# Patient Record
Sex: Female | Born: 2000 | Race: White | Hispanic: No | Marital: Single | Smoking: Current every day smoker
Health system: Southern US, Community
[De-identification: ages and names within clinical notes are randomized; demographics above are authoritative.]

## PROBLEM LIST (undated history)

## (undated) DIAGNOSIS — J45909 Unspecified asthma, uncomplicated: Secondary | ICD-10-CM

## (undated) DIAGNOSIS — A749 Chlamydial infection, unspecified: Principal | ICD-10-CM

## (undated) HISTORY — DX: Chlamydial infection, unspecified: A74.9

---
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
## 2019-01-02 ENCOUNTER — Other Ambulatory Visit (HOSPITAL_COMMUNITY)
Admission: RE | Admit: 2019-01-02 | Discharge: 2019-01-02 | Disposition: A | Discharge status: Home / Self Care | Payer: Medicaid Other | Source: Ambulatory Visit | Attending: Obstetrics and Gynecology | Admitting: Obstetrics and Gynecology

## 2019-01-02 ENCOUNTER — Other Ambulatory Visit: Payer: Self-pay

## 2019-01-02 ENCOUNTER — Ambulatory Visit: Payer: Medicaid Other

## 2019-01-02 VITALS — BP 106/77 | HR 72

## 2019-01-02 DIAGNOSIS — Z202 Contact with and (suspected) exposure to infections with a predominantly sexual mode of transmission: Secondary | ICD-10-CM

## 2019-01-02 NOTE — Progress Notes (Signed)
SUBJECTIVE:  18 y.o. female complains of  vaginal discharge for a couple of days.  Denies abnormal vaginal bleeding or significant pelvic pain or fever. No UTI symptoms. Denies history of known exposure to STD.  No LMP recorded.  OBJECTIVE:  She appears well, afebrile. Urine dipstick: not at this time  ASSESSMENT:  Vaginal Discharge:small amount  Vaginal Odor: none    PLAN:  GC, chlamydia, trichomonas, BVAG, CVAG probe sent to lab. Treatment: To be determined once lab results are received ROV prn if symptoms persist or worsen.

## 2019-01-03 ENCOUNTER — Other Ambulatory Visit: Payer: Self-pay | Admitting: Obstetrics & Gynecology

## 2019-01-03 DIAGNOSIS — B9689 Other specified bacterial agents as the cause of diseases classified elsewhere: Secondary | ICD-10-CM

## 2019-01-03 LAB — CERVICOVAGINAL ANCILLARY ONLY
Bacterial Vaginitis (gardnerella): POSITIVE — AB
Candida Glabrata: NEGATIVE
Candida Vaginitis: NEGATIVE
Chlamydia: NEGATIVE
Comment: NEGATIVE
Comment: NEGATIVE
Comment: NEGATIVE
Comment: NEGATIVE
Comment: NEGATIVE
Comment: NORMAL
Neisseria Gonorrhea: NEGATIVE
Trichomonas: NEGATIVE

## 2019-01-03 MED ORDER — TINIDAZOLE 500 MG PO TABS: 2.0000 g | ORAL_TABLET | Freq: Every day | ORAL | 2 refills | Status: DC

## 2019-01-29 ENCOUNTER — Ambulatory Visit: Payer: Medicaid Other

## 2019-01-29 ENCOUNTER — Other Ambulatory Visit: Payer: Self-pay

## 2019-01-29 DIAGNOSIS — Z111 Encounter for screening for respiratory tuberculosis: Secondary | ICD-10-CM

## 2019-01-29 DIAGNOSIS — Z23 Encounter for immunization: Secondary | ICD-10-CM

## 2019-01-29 NOTE — Progress Notes (Signed)
Patient given Menveo. Declined flu and Men B Richmond Campbell, RN

## 2019-02-01 ENCOUNTER — Telehealth: Payer: Self-pay

## 2019-02-01 ENCOUNTER — Ambulatory Visit (LOCAL_COMMUNITY_HEALTH_CENTER): Payer: Medicaid Other

## 2019-02-01 ENCOUNTER — Other Ambulatory Visit: Payer: Medicaid Other

## 2019-02-01 ENCOUNTER — Other Ambulatory Visit: Payer: Self-pay

## 2019-02-01 DIAGNOSIS — Z111 Encounter for screening for respiratory tuberculosis: Secondary | ICD-10-CM

## 2019-02-01 LAB — TB SKIN TEST
Induration: 0 mm
TB Skin Test: NEGATIVE

## 2019-02-01 NOTE — Telephone Encounter (Signed)
Client DNKA this am in Nurse Clinic for [facility]. Call to client who states forgot appt and PPDR appt rescheduled for this afternoon. Jossie Ng, RN

## 2019-02-22 ENCOUNTER — Other Ambulatory Visit: Payer: Self-pay

## 2019-02-22 ENCOUNTER — Ambulatory Visit (INDEPENDENT_AMBULATORY_CARE_PROVIDER_SITE_OTHER): Payer: Medicaid Other | Admitting: Family Medicine

## 2019-02-22 ENCOUNTER — Encounter: Payer: Self-pay | Admitting: Family Medicine

## 2019-02-22 ENCOUNTER — Other Ambulatory Visit (HOSPITAL_COMMUNITY)
Admission: RE | Admit: 2019-02-22 | Discharge: 2019-02-22 | Disposition: A | Discharge status: Home / Self Care | Payer: Medicaid Other | Source: Ambulatory Visit | Attending: Family Medicine | Admitting: Family Medicine

## 2019-02-22 VITALS — BP 103/75 | HR 72 | Wt 106.0 lb

## 2019-02-22 DIAGNOSIS — Z113 Encounter for screening for infections with a predominantly sexual mode of transmission: Secondary | ICD-10-CM

## 2019-02-22 NOTE — Progress Notes (Signed)
    Subjective:    Patient ID: Dawn Howell is a 19 y.o. female presenting with No chief complaint on file.  on 02/22/2019  HPI: Noticed some red bumps in the middle of her vaginal. No burning and no blisters. Denies fevers, chills, no dysuria, + vaginal discharge. No oder, no irritation. No new partner.  Review of Systems  Constitutional: Negative for chills and fever.  Respiratory: Negative for shortness of breath.   Cardiovascular: Negative for chest pain.  Gastrointestinal: Negative for abdominal pain, nausea and vomiting.  Genitourinary: Negative for dysuria.  Skin: Negative for rash.      Objective:    BP 103/75   Pulse 72   Wt 106 lb (48.1 kg)   BMI 18.78 kg/m  Physical Exam Constitutional:      General: She is not in acute distress.    Appearance: She is well-developed.  HENT:     Head: Normocephalic and atraumatic.  Eyes:     General: No scleral icterus. Cardiovascular:     Rate and Rhythm: Normal rate.  Pulmonary:     Effort: Pulmonary effort is normal.  Abdominal:     Palpations: Abdomen is soft.  Genitourinary:    Comments: NEFG, no warts, no blisters, minimal white discharge.  Musculoskeletal:     Cervical back: Neck supple.  Skin:    General: Skin is warm and dry.  Neurological:     Mental Status: She is alert and oriented to person, place, and time.         Assessment & Plan:   Problem List Items Addressed This Visit    None    Visit Diagnoses    Screening for STDs (sexually transmitted diseases)    -  Primary   no evidence of any bumps--reviewed pics of common issues, check STD, wet prep.   Relevant Orders   Cervicovaginal ancillary only( [place])      Total face-to-face time with patient: 15 minutes. Over 50% of encounter was spent on counseling and coordination of care. Return in about 3 months (around 05/23/2019), or if symptoms worsen or fail to improve.  Reva Bores 02/22/2019 3:32 PM

## 2019-02-22 NOTE — Progress Notes (Signed)
Patient reports having small bumps on vaginal area for 1-2 weeks. No itching or burning.

## 2019-02-23 LAB — CERVICOVAGINAL ANCILLARY ONLY
Bacterial Vaginitis (gardnerella): NEGATIVE
Candida Glabrata: NEGATIVE
Candida Vaginitis: NEGATIVE
Chlamydia: NEGATIVE
Comment: NEGATIVE
Comment: NEGATIVE
Comment: NEGATIVE
Comment: NEGATIVE
Comment: NEGATIVE
Comment: NORMAL
Neisseria Gonorrhea: NEGATIVE
Trichomonas: NEGATIVE

## 2019-06-13 ENCOUNTER — Ambulatory Visit: Payer: Medicaid Other

## 2019-06-14 ENCOUNTER — Ambulatory Visit: Payer: Medicaid Other

## 2019-08-13 ENCOUNTER — Other Ambulatory Visit: Payer: Self-pay

## 2019-08-13 ENCOUNTER — Emergency Department
Admission: EM | Admit: 2019-08-13 | Discharge: 2019-08-13 | Disposition: A | Discharge status: Home / Self Care | Payer: Medicaid Other | Attending: Emergency Medicine | Admitting: Emergency Medicine

## 2019-08-13 DIAGNOSIS — R109 Unspecified abdominal pain: Secondary | ICD-10-CM | POA: Diagnosis not present

## 2019-08-13 DIAGNOSIS — R112 Nausea with vomiting, unspecified: Secondary | ICD-10-CM | POA: Diagnosis not present

## 2019-08-13 DIAGNOSIS — J45909 Unspecified asthma, uncomplicated: Secondary | ICD-10-CM | POA: Diagnosis not present

## 2019-08-13 DIAGNOSIS — K529 Noninfective gastroenteritis and colitis, unspecified: Secondary | ICD-10-CM

## 2019-08-13 DIAGNOSIS — N898 Other specified noninflammatory disorders of vagina: Secondary | ICD-10-CM | POA: Insufficient documentation

## 2019-08-13 LAB — URINALYSIS, COMPLETE (UACMP) WITH MICROSCOPIC
Bacteria, UA: NONE SEEN
Bilirubin Urine: NEGATIVE
Glucose, UA: NEGATIVE mg/dL
Hgb urine dipstick: NEGATIVE
Ketones, ur: NEGATIVE mg/dL
Leukocytes,Ua: NEGATIVE
Nitrite: NEGATIVE
Protein, ur: NEGATIVE mg/dL
Specific Gravity, Urine: 1.009 (ref 1.005–1.030)
pH: 6 (ref 5.0–8.0)

## 2019-08-13 LAB — WET PREP, GENITAL
Clue Cells Wet Prep HPF POC: NONE SEEN
Sperm: NONE SEEN
Trich, Wet Prep: NONE SEEN
Yeast Wet Prep HPF POC: NONE SEEN

## 2019-08-13 LAB — COMPREHENSIVE METABOLIC PANEL
ALT: 12 U/L (ref 0–44)
AST: 27 U/L (ref 15–41)
Albumin: 4.1 g/dL (ref 3.5–5.0)
Alkaline Phosphatase: 56 U/L (ref 38–126)
Anion gap: 6 (ref 5–15)
BUN: 9 mg/dL (ref 6–20)
CO2: 26 mmol/L (ref 22–32)
Calcium: 8.9 mg/dL (ref 8.9–10.3)
Chloride: 105 mmol/L (ref 98–111)
Creatinine, Ser: 0.77 mg/dL (ref 0.44–1.00)
GFR calc Af Amer: >60 mL/min (ref >60)
GFR calc non Af Amer: >60 mL/min (ref >60)
Glucose, Bld: 90 mg/dL (ref 70–99)
Potassium: 3.7 mmol/L (ref 3.5–5.1)
Sodium: 137 mmol/L (ref 135–145)
Total Bilirubin: 0.6 mg/dL (ref 0.3–1.2)
Total Protein: 7.8 g/dL (ref 6.5–8.1)

## 2019-08-13 LAB — POCT PREGNANCY, URINE: Preg Test, Ur: NEGATIVE

## 2019-08-13 LAB — CBC
HCT: 42.8 % (ref 36.0–46.0)
Hemoglobin: 14.5 g/dL (ref 12.0–15.0)
MCH: 29.8 pg (ref 26.0–34.0)
MCHC: 33.9 g/dL (ref 30.0–36.0)
MCV: 87.9 fL (ref 80.0–100.0)
Platelets: 221 10*3/uL (ref 150–400)
RBC: 4.87 MIL/uL (ref 3.87–5.11)
RDW: 12.3 % (ref 11.5–15.5)
WBC: 4.2 10*3/uL (ref 4.0–10.5)
nRBC: 0 % (ref 0.0–0.2)

## 2019-08-13 LAB — CHLAMYDIA/NGC RT PCR (ARMC ONLY)
Chlamydia Tr: NOT DETECTED
N gonorrhoeae: NOT DETECTED

## 2019-08-13 LAB — LIPASE, BLOOD: Lipase: 29 U/L (ref 11–51)

## 2019-08-13 MED ORDER — SODIUM CHLORIDE 0.9% FLUSH: 3.0000 mL | Freq: Once | INTRAVENOUS | Status: DC

## 2019-08-13 MED ORDER — OXYCODONE-ACETAMINOPHEN 5-325 MG PO TABS
1.0000 | ORAL_TABLET | Freq: Once | ORAL | Status: AC
  Administered 2019-08-13: 1 via ORAL
  Filled 2019-08-13: qty 1

## 2019-08-13 MED ORDER — DICYCLOMINE HCL 10 MG PO CAPS: 10.0000 mg | ORAL_CAPSULE | Freq: Four times a day (QID) | ORAL | 0 refills | Status: DC

## 2019-08-13 MED ORDER — ONDANSETRON HCL 4 MG PO TABS: 4.0000 mg | ORAL_TABLET | Freq: Every day | ORAL | 0 refills | Status: DC | PRN

## 2019-08-13 MED ORDER — ONDANSETRON 4 MG PO TBDP
4.0000 mg | ORAL_TABLET | Freq: Once | ORAL | Status: AC
  Administered 2019-08-13: 4 mg via ORAL
  Filled 2019-08-13: qty 1

## 2019-08-13 NOTE — ED Provider Notes (Signed)
[facility] Emergency Department Provider Note  Time seen: 3:33 PM  I have reviewed the triage vital signs and the nursing notes.   HISTORY  Chief Complaint Abdominal Pain   HPI Dawn Howell is a 19 y.o. female with no significant past medical history presents to the emergency department for abdominal pain.  According to the patient since this morning she has been experiencing intermittent sharp pain in her abdomen.  On review of systems patient also states vaginal discharge.  Denies any dysuria.  Does state nausea with an episode of vomiting this morning.  Denies diarrhea.  Last menstrual period was several weeks ago the patient states she is on birth control and has irregular periods.  Denies any known fever.   Past Medical History:  Diagnosis Date  . Asthma   . Chlamydia infection 02/21/2017 and 07/26/2017 and 10/25/2017    Patient Active Problem List   Diagnosis Date Noted  . Nexplanon insertion 02/01/2018  . Chlamydia infection 02/21/2017    History reviewed. No pertinent surgical history.  Prior to Admission medications   Medication Sig Start Date End Date Taking? Authorizing Provider  tinidazole (TINDAMAX) 500 MG tablet Take 4 tablets (2,000 mg total) by mouth daily with breakfast. For two days Patient not taking: Reported on 02/22/2019 01/03/19   Tereso Newcomer, MD    No Known Allergies  No family history on file.  Social History Social History   Tobacco Use  . Smoking status: Never Smoker  . Smokeless tobacco: Never Used  Vaping Use  . Vaping Use: Never used  Substance Use Topics  . Alcohol use: No  . Drug use: No    Review of Systems Constitutional: Negative for fever. Cardiovascular: Negative for chest pain. Respiratory: Negative for shortness of breath. Gastrointestinal: Intermittent abdominal pain.  Positive nausea.  Negative for diarrhea. Genitourinary: Negative for urinary compaints.  Positive for vaginal  discharge. Musculoskeletal: Negative for musculoskeletal complaints Neurological: Negative for headache All other ROS negative  ____________________________________________   PHYSICAL EXAM:  VITAL SIGNS: ED Triage Vitals  Enc Vitals Group     BP 08/13/19 1312 104/60     Pulse Rate 08/13/19 1312 65     Resp 08/13/19 1312 18     Temp 08/13/19 1312 99 F (37.2 C)     Temp src --      SpO2 08/13/19 1312 99 %     Weight 08/13/19 1310 106 lb (48.1 kg)     Height 08/13/19 1310 5\' 2"  (1.575 m)     Head Circumference --      Peak Flow --      Pain Score 08/13/19 1310 7     Pain Loc --      Pain Edu? --      Excl. in [place]? --    Constitutional: Alert and oriented. Well appearing and in no distress. Eyes: Normal exam ENT      Head: Normocephalic and atraumatic.      Mouth/Throat: Mucous membranes are moist. Cardiovascular: Normal rate, regular rhythm.  Respiratory: Normal respiratory effort without tachypnea nor retractions. Breath sounds are clear Gastrointestinal: Soft, slight tenderness diffusely without focal tenderness identified.  No rebound guarding or distention. Musculoskeletal: Nontender with normal range of motion in all extremities.  Neurologic:  Normal speech and language. No gross focal neurologic deficits  Skin:  Skin is warm, dry and intact.  Psychiatric: Mood and affect are normal.   ____________________________________________    INITIAL IMPRESSION / ASSESSMENT AND  PLAN / ED COURSE  Pertinent labs & imaging results that were available during my care of the patient were reviewed by me and considered in my medical decision making (see chart for details).   Patient presents emergency department for abdominal discomfort starting this morning along with nausea.  Denies diarrhea.  No fever.  No dysuria.  Does state vaginal discharge.  Pelvic exam shows minimal discharge, no significant cervical tenderness or erythema.  Wet prep GC/chlamydia have been sent.  Urinalysis  pending.  We will continue to closely monitor.  Basic labs including CBC and CMP and lipase are largely normal.  Patient's urinalysis is normal as well.  Wet prep and GC/committee have resulted negative.  Overall patient appears very well.  We will treat as symptomatic gastroenteritis, we will prescribe Zofran and Bentyl to be used as needed.  Patient agreeable plan of care.  Discussed return precautions with the patient and her mother.  Dawn Howell was evaluated in Emergency Department on 08/13/2019 for the symptoms described in the history of present illness. She was evaluated in the context of the global COVID-19 pandemic, which necessitated consideration that the patient might be at risk for infection with the SARS-CoV-2 virus that causes COVID-19. Institutional protocols and algorithms that pertain to the evaluation of patients at risk for COVID-19 are in a state of rapid change based on information released by regulatory bodies including the CDC and federal and state organizations. These policies and algorithms were followed during the patient's care in the ED.  ____________________________________________   FINAL CLINICAL IMPRESSION(S) / ED DIAGNOSES  Abdominal pain   Minna Antis, MD 08/13/19 1813

## 2019-08-13 NOTE — ED Triage Notes (Signed)
Pt comes via [facility] from home with c/o abdominal pain. Pt states the pain started earlier this am. Pt states some vomiting. Pt denies any diarrhea.  Pt denies any pregnancy. Pt denies any urinary symptoms

## 2019-08-30 ENCOUNTER — Other Ambulatory Visit (HOSPITAL_COMMUNITY)
Admission: RE | Admit: 2019-08-30 | Discharge: 2019-08-30 | Disposition: A | Discharge status: Home / Self Care | Payer: Medicaid Other | Source: Ambulatory Visit | Attending: Family Medicine | Admitting: Family Medicine

## 2019-08-30 ENCOUNTER — Other Ambulatory Visit: Payer: Self-pay

## 2019-08-30 ENCOUNTER — Ambulatory Visit (INDEPENDENT_AMBULATORY_CARE_PROVIDER_SITE_OTHER): Payer: Medicaid Other | Admitting: *Deleted

## 2019-08-30 VITALS — BP 110/74 | HR 85

## 2019-08-30 DIAGNOSIS — N898 Other specified noninflammatory disorders of vagina: Secondary | ICD-10-CM | POA: Insufficient documentation

## 2019-08-30 NOTE — Progress Notes (Signed)
SUBJECTIVE:  19 y.o. female complains of an increase in vaginal discharge. Denies abnormal vaginal bleeding or significant pelvic pain or fever. No UTI symptoms. Denies history of known exposure to STD.  No LMP recorded.  OBJECTIVE:  She appears well, afebrile. Urine dipstick: not done.  ASSESSMENT:  Vaginal Discharge     PLAN:  GC, chlamydia, trichomonas, BVAG, CVAG probe sent to lab. Treatment: To be determined once lab results are received ROV prn if symptoms persist or worsen.

## 2019-08-30 NOTE — Progress Notes (Signed)
Attestation of Attending Supervision of clinical support staff: I agree with the care provided to this patient and was available for any consultation.  I have reviewed the RN's note and chart. I was available for consult and to see the patient if needed.   Ludell Zacarias Niles Layza Summa, MD, MPH, ABFM Attending Physician Faculty Practice- [facility]  

## 2019-08-31 LAB — CERVICOVAGINAL ANCILLARY ONLY
Bacterial Vaginitis (gardnerella): NEGATIVE
Candida Glabrata: NEGATIVE
Candida Vaginitis: NEGATIVE
Chlamydia: NEGATIVE
Comment: NEGATIVE
Comment: NEGATIVE
Comment: NEGATIVE
Comment: NEGATIVE
Comment: NEGATIVE
Comment: NORMAL
Neisseria Gonorrhea: NEGATIVE
Trichomonas: NEGATIVE

## 2020-11-26 ENCOUNTER — Ambulatory Visit (HOSPITAL_COMMUNITY): Admit: 2020-11-26 | Payer: No Typology Code available for payment source

## 2020-11-28 ENCOUNTER — Other Ambulatory Visit: Payer: Self-pay

## 2020-11-28 ENCOUNTER — Ambulatory Visit
Admission: EM | Admit: 2020-11-28 | Discharge: 2020-11-28 | Disposition: A | Discharge status: Home / Self Care | Payer: Medicaid Other | Attending: Emergency Medicine | Admitting: Emergency Medicine

## 2020-11-28 ENCOUNTER — Encounter: Payer: Self-pay | Admitting: Emergency Medicine

## 2020-11-28 DIAGNOSIS — Z113 Encounter for screening for infections with a predominantly sexual mode of transmission: Secondary | ICD-10-CM | POA: Diagnosis not present

## 2020-11-28 DIAGNOSIS — R300 Dysuria: Secondary | ICD-10-CM | POA: Insufficient documentation

## 2020-11-28 LAB — POCT URINALYSIS DIP (MANUAL ENTRY)
Bilirubin, UA: NEGATIVE
Glucose, UA: NEGATIVE mg/dL
Ketones, POC UA: NEGATIVE mg/dL
Nitrite, UA: NEGATIVE
Protein Ur, POC: 30 mg/dL — AB
Spec Grav, UA: >=1.03 — AB (ref 1.010–1.025)
Urobilinogen, UA: 0.2 E.U./dL
pH, UA: 5.5 (ref 5.0–8.0)

## 2020-11-28 LAB — POCT URINE PREGNANCY: Preg Test, Ur: NEGATIVE

## 2020-11-28 MED ORDER — CEPHALEXIN 500 MG PO CAPS: 500.0000 mg | ORAL_CAPSULE | Freq: Two times a day (BID) | ORAL | 0 refills | Status: DC

## 2020-11-28 NOTE — Discharge Instructions (Addendum)
Take the antibiotic as directed.  The urine culture is pending.  We will call you if it shows the need to change or discontinue your antibiotic.   ° °Your vaginal tests are pending.  If your test results are positive, we will call you.  Do not have sexual activity for at least 7 days.   ° °Follow up with your primary care provider if your symptoms are not improving.   ° °

## 2020-11-28 NOTE — ED Provider Notes (Signed)
Dawn Howell    CSN: [id number] Arrival date & time: 11/28/20  1304      History   Chief Complaint Chief Complaint  Patient presents with   Dysuria   Urinary Frequency     HPI Dawn Howell is a 20 y.o. female.  Patient presents with 3-day history of dysuria and urinary frequency.  She also reports scant pink vaginal discharge but is just finishing her menstrual cycle.  She requests STD testing.  She is sexually active in a monogamous relationship.  She denies fever, chills, abdominal pain, back pain, pelvic pain, or other symptoms.  Her medical history includes asthma and chlamydia.  The history is provided by the patient and medical records.   Past Medical History:  Diagnosis Date   Asthma    Chlamydia infection 02/21/2017 and 07/26/2017 and 10/25/2017    Patient Active Problem List   Diagnosis Date Noted   Nexplanon insertion 02/01/2018   Chlamydia infection 02/21/2017    History reviewed. No pertinent surgical history.  OB History     Gravida  0   Para  0   Term  0   Preterm  0   AB  0   Living  0      SAB  0   IAB  0   Ectopic  0   Multiple  0   Live Births  0            Home Medications    Prior to Admission medications   Medication Sig Start Date End Date Taking? Authorizing Provider  cephALEXin (KEFLEX) 500 MG capsule Take 1 capsule (500 mg total) by mouth 2 (two) times daily for 5 days. 11/28/20 12/03/20 Yes Mickie Bail, NP  dicyclomine (BENTYL) 10 MG capsule Take 1 capsule (10 mg total) by mouth 4 (four) times daily for 14 days. 08/13/19 08/27/19  Minna Antis, MD  ondansetron (ZOFRAN) 4 MG tablet Take 1 tablet (4 mg total) by mouth daily as needed for nausea or vomiting. 08/13/19   Minna Antis, MD  tinidazole (TINDAMAX) 500 MG tablet Take 4 tablets (2,000 mg total) by mouth daily with breakfast. For two days Patient not taking: Reported on 02/22/2019 01/03/19   Tereso Newcomer, MD    Family History History  reviewed. No pertinent family history.  Social History Social History   Tobacco Use   Smoking status: Never   Smokeless tobacco: Never  Vaping Use   Vaping Use: Never used  Substance Use Topics   Alcohol use: No   Drug use: No     Allergies   Patient has no known allergies.   Review of Systems Review of Systems  Constitutional:  Negative for chills and fever.  Respiratory:  Negative for cough and shortness of breath.   Cardiovascular:  Negative for chest pain and palpitations.  Gastrointestinal:  Negative for abdominal pain and vomiting.  Genitourinary:  Positive for dysuria, frequency and vaginal discharge. Negative for flank pain, hematuria and pelvic pain.  Musculoskeletal:  Negative for arthralgias and back pain.  Skin:  Negative for color change and rash.  All other systems reviewed and are negative.   Physical Exam Triage Vital Signs ED Triage Vitals  Enc Vitals Group     BP      Pulse      Resp      Temp      Temp src      SpO2      Weight  Height      Head Circumference      Peak Flow      Pain Score      Pain Loc      Pain Edu?      Excl. in [place]?    No data found.  Updated Vital Signs BP 104/72 (BP Location: Left Arm)   Pulse (!) 107   Temp 99.4 F (37.4 C) (Oral)   LMP 11/20/2020   SpO2 95%   Visual Acuity Right Eye Distance:   Left Eye Distance:   Bilateral Distance:    Right Eye Near:   Left Eye Near:    Bilateral Near:     Physical Exam Vitals and nursing note reviewed.  Constitutional:      General: She is not in acute distress.    Appearance: She is well-developed. She is not ill-appearing.  HENT:     Head: Normocephalic and atraumatic.     Mouth/Throat:     Mouth: Mucous membranes are moist.  Eyes:     Conjunctiva/sclera: Conjunctivae normal.  Cardiovascular:     Rate and Rhythm: Normal rate and regular rhythm.     Heart sounds: Normal heart sounds.  Pulmonary:     Effort: Pulmonary effort is normal. No  respiratory distress.     Breath sounds: Normal breath sounds.  Abdominal:     Palpations: Abdomen is soft.     Tenderness: There is no abdominal tenderness. There is no right CVA tenderness, left CVA tenderness, guarding or rebound.  Musculoskeletal:     Cervical back: Neck supple.  Skin:    General: Skin is warm and dry.  Neurological:     Mental Status: She is alert.  Psychiatric:        Mood and Affect: Mood normal.        Behavior: Behavior normal.     UC Treatments / Results  Labs (all labs ordered are listed, but only abnormal results are displayed) Labs Reviewed  POCT URINALYSIS DIP (MANUAL ENTRY) - Abnormal; Notable for the following components:      Result Value   Clarity, UA cloudy (*)    Spec Grav, UA >=1.030 (*)    Blood, UA large (*)    Protein Ur, POC =30 (*)    Leukocytes, UA Moderate (2+) (*)    All other components within normal limits  URINE CULTURE  POCT URINE PREGNANCY  CERVICOVAGINAL ANCILLARY ONLY    EKG   Radiology No results found.  Procedures Procedures (including critical care time)  Medications Ordered in UC Medications - No data to display  Initial Impression / Assessment and Plan / UC Course  I have reviewed the triage vital signs and the nursing notes.  Pertinent labs & imaging results that were available during my care of the patient were reviewed by me and considered in my medical decision making (see chart for details).  Dysuria, STD screening.  Treating with Keflex. Urine culture pending. Discussed with patient that we will call her if the urine culture shows the need to change or discontinue the antibiotic.  Patient obtained vaginal self swab for testing.  Discussed with patient that she may require treatment if her test results are positive.  Instructed her to abstain from sexual activity for at least 7 days.  Instructed her to follow-up with her PCP if her symptoms are not improving.  Patient agrees to plan of care.        Final Clinical Impressions(s) / UC Diagnoses  Final diagnoses:  Dysuria  Screening for STD (sexually transmitted disease)     Discharge Instructions      Take the antibiotic as directed.  The urine culture is pending.  We will call you if it shows the need to change or discontinue your antibiotic.    Your vaginal tests are pending.  If your test results are positive, we will call you.  Do not have sexual activity for at least 7 days.    Follow up with your primary care provider if your symptoms are not improving.         ED Prescriptions     Medication Sig Dispense Auth. Provider   cephALEXin (KEFLEX) 500 MG capsule Take 1 capsule (500 mg total) by mouth 2 (two) times daily for 5 days. 10 capsule Sharion Balloon, NP      PDMP not reviewed this encounter.   Sharion Balloon, NP 11/28/20 1422

## 2020-11-28 NOTE — ED Triage Notes (Signed)
Pt presents for dysuria and urinary frequency x 3 days. Pt would also like STD testing

## 2020-11-29 LAB — URINE CULTURE: Culture: <10000 — AB

## 2020-12-01 LAB — CERVICOVAGINAL ANCILLARY ONLY
Bacterial Vaginitis (gardnerella): POSITIVE — AB
Candida Glabrata: NEGATIVE
Candida Vaginitis: NEGATIVE
Chlamydia: NEGATIVE
Comment: NEGATIVE
Comment: NEGATIVE
Comment: NEGATIVE
Comment: NEGATIVE
Comment: NEGATIVE
Comment: NORMAL
Neisseria Gonorrhea: NEGATIVE
Trichomonas: NEGATIVE

## 2020-12-02 ENCOUNTER — Telehealth (HOSPITAL_COMMUNITY): Payer: Self-pay | Admitting: Emergency Medicine

## 2020-12-02 MED ORDER — METRONIDAZOLE 500 MG PO TABS: 500.0000 mg | ORAL_TABLET | Freq: Two times a day (BID) | ORAL | 0 refills | Status: DC

## 2021-01-16 ENCOUNTER — Encounter: Payer: Self-pay | Admitting: Emergency Medicine

## 2021-01-16 ENCOUNTER — Ambulatory Visit
Admission: EM | Admit: 2021-01-16 | Discharge: 2021-01-16 | Disposition: A | Discharge status: Home / Self Care | Payer: Medicaid Other

## 2021-01-16 DIAGNOSIS — L989 Disorder of the skin and subcutaneous tissue, unspecified: Secondary | ICD-10-CM | POA: Diagnosis not present

## 2021-01-16 NOTE — ED Triage Notes (Signed)
Pt here with a sore in mouth on bottom lip x 1 month.

## 2021-01-16 NOTE — Discharge Instructions (Addendum)
Avoid pressing, biting or picking at the lesion

## 2021-01-16 NOTE — ED Provider Notes (Signed)
[name]    CSN: [id number] Arrival date & time: 01/16/21  1710      History   Chief Complaint Chief Complaint  Patient presents with   Mouth Lesions    HPI Dawn Howell is a 20 y.o. female.   HPI  Mouth Lesion: Patient reports that she has had a lesion of her lower right lip for over 1 month. Non-painful lesion which does bleed. Sometimes has a thicker gelatinous fluid but not always. She has bitten it off and it has returned. She has tried OTC ulcer treatments without success. Non-smoker.   Past Medical History:  Diagnosis Date   Asthma    Chlamydia infection 02/21/2017 and 07/26/2017 and 10/25/2017    Patient Active Problem List   Diagnosis Date Noted   Nexplanon insertion 02/01/2018   Chlamydia infection 02/21/2017    History reviewed. No pertinent surgical history.  OB History     Gravida  0   Para  0   Term  0   Preterm  0   AB  0   Living  0      SAB  0   IAB  0   Ectopic  0   Multiple  0   Live Births  0            Home Medications    Prior to Admission medications   Medication Sig Start Date End Date Taking? Authorizing Provider  dicyclomine (BENTYL) 10 MG capsule Take 1 capsule (10 mg total) by mouth 4 (four) times daily for 14 days. 08/13/19 08/27/19  Minna Antis, MD  metroNIDAZOLE (FLAGYL) 500 MG tablet Take 1 tablet (500 mg total) by mouth 2 (two) times daily. 12/02/20   Merrilee Jansky, MD  ondansetron (ZOFRAN) 4 MG tablet Take 1 tablet (4 mg total) by mouth daily as needed for nausea or vomiting. 08/13/19   Minna Antis, MD  tinidazole (TINDAMAX) 500 MG tablet Take 4 tablets (2,000 mg total) by mouth daily with breakfast. For two days Patient not taking: Reported on 02/22/2019 01/03/19   Tereso Newcomer, MD    Family History History reviewed. No pertinent family history.  Social History Social History   Tobacco Use   Smoking status: Never   Smokeless tobacco: Never  Vaping Use   Vaping Use:  Never used  Substance Use Topics   Alcohol use: No   Drug use: No     Allergies   Patient has no known allergies.   Review of Systems Review of Systems  As stated above in HPI Physical Exam Triage Vital Signs ED Triage Vitals [01/16/21 1755]  Enc Vitals Group     BP      Pulse      Resp      Temp      Temp src      SpO2      Weight      Height      Head Circumference      Peak Flow      Pain Score 5     Pain Loc      Pain Edu?      Excl. in [place]?    No data found.  Updated Vital Signs BP (!) 105/58    Pulse 64    Temp 98.8 F (37.1 C)    Resp 18    SpO2 98%   Physical Exam Vitals and nursing note reviewed.  Constitutional:      General: She  is not in acute distress.    Appearance: Normal appearance. She is not ill-appearing, toxic-appearing or diaphoretic.  HENT:     Head: Normocephalic and atraumatic.     Nose: Nose normal.     Mouth/Throat:     Mouth: Mucous membranes are moist.     Comments: See picture below Eyes:     Extraocular Movements: Extraocular movements intact.     Pupils: Pupils are equal, round, and reactive to light.  Cardiovascular:     Rate and Rhythm: Normal rate and regular rhythm.     Heart sounds: Normal heart sounds.  Pulmonary:     Effort: Pulmonary effort is normal.     Breath sounds: Normal breath sounds.  Musculoskeletal:     Cervical back: Neck supple.  Skin:    General: Skin is warm.  Neurological:     Mental Status: She is alert and oriented to person, place, and time.      UC Treatments / Results  Labs (all labs ordered are listed, but only abnormal results are displayed) Labs Reviewed - No data to display  EKG   Radiology No results found.  Procedures Procedures (including critical care time)  Medications Ordered in UC Medications - No data to display  Initial Impression / Assessment and Plan / UC Course  I have reviewed the triage vital signs and the nursing notes.  Pertinent labs & imaging  results that were available during my care of the patient were reviewed by me and considered in my medical decision making (see chart for details).     New. History and physical exam is concerning for abnormal lesion that likely will need to be biopsied. I have recommended an oral surgeon's office asap for evaluation. As this is not painful she will just try to avoid picking, biting or injuring the area. Follow up as needed.    Final Clinical Impressions(s) / UC Diagnoses   Final diagnoses:  Non-healing skin lesion     Discharge Instructions      Avoid pressing, biting or picking at the lesion     ED Prescriptions   None    PDMP not reviewed this encounter.   Rushie Chestnut, [place] 01/16/21 1813

## 2021-03-10 ENCOUNTER — Ambulatory Visit: Payer: Medicaid Other | Admitting: Family Medicine

## 2021-04-17 ENCOUNTER — Ambulatory Visit: Payer: Self-pay

## 2021-04-17 ENCOUNTER — Encounter: Payer: Self-pay | Admitting: Advanced Practice Midwife

## 2021-04-17 ENCOUNTER — Other Ambulatory Visit: Payer: Self-pay

## 2021-04-17 ENCOUNTER — Ambulatory Visit (LOCAL_COMMUNITY_HEALTH_CENTER): Payer: Medicaid Other | Admitting: Advanced Practice Midwife

## 2021-04-17 VITALS — BP 103/66 | Ht 64.0 in | Wt 108.4 lb

## 2021-04-17 DIAGNOSIS — Z720 Tobacco use: Secondary | ICD-10-CM | POA: Diagnosis not present

## 2021-04-17 DIAGNOSIS — Z3046 Encounter for surveillance of implantable subdermal contraceptive: Secondary | ICD-10-CM | POA: Diagnosis not present

## 2021-04-17 DIAGNOSIS — Z309 Encounter for contraceptive management, unspecified: Secondary | ICD-10-CM

## 2021-04-17 DIAGNOSIS — T7421XS Adult sexual abuse, confirmed, sequela: Secondary | ICD-10-CM

## 2021-04-17 DIAGNOSIS — B9689 Other specified bacterial agents as the cause of diseases classified elsewhere: Secondary | ICD-10-CM

## 2021-04-17 DIAGNOSIS — F1290 Cannabis use, unspecified, uncomplicated: Secondary | ICD-10-CM

## 2021-04-17 DIAGNOSIS — Z3009 Encounter for other general counseling and advice on contraception: Secondary | ICD-10-CM

## 2021-04-17 DIAGNOSIS — T7421XA Adult sexual abuse, confirmed, initial encounter: Secondary | ICD-10-CM | POA: Insufficient documentation

## 2021-04-17 LAB — WET PREP FOR TRICH, YEAST, CLUE
Trichomonas Exam: NEGATIVE
Yeast Exam: NEGATIVE

## 2021-04-17 MED ORDER — METRONIDAZOLE 500 MG PO TABS: 500.0000 mg | ORAL_TABLET | Freq: Two times a day (BID) | ORAL | 0 refills | Status: AC

## 2021-04-17 NOTE — Progress Notes (Signed)
Pt here for PE and Pap.  Wet mount results reviewed and medication dispensed per Provider orders.  Berdie Ogren, RN ? ?

## 2021-04-17 NOTE — Addendum Note (Signed)
Addended by: Windle Guard on: 04/17/2021 11:29 AM ? ? Modules accepted: Orders ? ?
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
## 2021-04-22 LAB — PAP IG (IMAGE GUIDED): PAP Smear Comment: 0

## 2021-07-23 ENCOUNTER — Ambulatory Visit: Payer: Medicaid Other

## 2021-07-24 ENCOUNTER — Ambulatory Visit: Payer: Medicaid Other

## 2021-09-25 ENCOUNTER — Ambulatory Visit: Payer: Medicaid Other

## 2022-02-01 ENCOUNTER — Ambulatory Visit
Admission: RE | Admit: 2022-02-01 | Discharge: 2022-02-01 | Disposition: A | Discharge status: Home / Self Care | Payer: Self-pay | Source: Ambulatory Visit | Attending: Pediatrics | Admitting: Pediatrics

## 2022-02-10 ENCOUNTER — Ambulatory Visit: Payer: Self-pay

## 2022-10-22 DIAGNOSIS — R309 Painful micturition, unspecified: Secondary | ICD-10-CM | POA: Diagnosis not present

## 2022-10-22 DIAGNOSIS — N3001 Acute cystitis with hematuria: Secondary | ICD-10-CM | POA: Diagnosis not present

## 2022-12-17 ENCOUNTER — Encounter: Payer: Self-pay | Admitting: Nurse Practitioner

## 2022-12-17 ENCOUNTER — Ambulatory Visit: Payer: Medicaid Other

## 2022-12-17 ENCOUNTER — Ambulatory Visit: Payer: Medicaid Other | Admitting: Nurse Practitioner

## 2022-12-17 VITALS — BP 114/81 | Ht 64.0 in | Wt 110.0 lb

## 2022-12-17 DIAGNOSIS — Z3202 Encounter for pregnancy test, result negative: Secondary | ICD-10-CM | POA: Diagnosis not present

## 2022-12-17 DIAGNOSIS — Z01419 Encounter for gynecological examination (general) (routine) without abnormal findings: Secondary | ICD-10-CM

## 2022-12-17 DIAGNOSIS — Z3046 Encounter for surveillance of implantable subdermal contraceptive: Secondary | ICD-10-CM | POA: Diagnosis not present

## 2022-12-17 DIAGNOSIS — Z309 Encounter for contraceptive management, unspecified: Secondary | ICD-10-CM

## 2022-12-17 DIAGNOSIS — Z3009 Encounter for other general counseling and advice on contraception: Secondary | ICD-10-CM

## 2022-12-17 DIAGNOSIS — Z113 Encounter for screening for infections with a predominantly sexual mode of transmission: Secondary | ICD-10-CM

## 2022-12-17 LAB — WET PREP FOR TRICH, YEAST, CLUE
Trichomonas Exam: NEGATIVE
Yeast Exam: NEGATIVE

## 2022-12-17 LAB — PREGNANCY, URINE: Preg Test, Ur: NEGATIVE

## 2022-12-17 NOTE — Progress Notes (Signed)
Pt is here for family planning visit.  Family planning packet reviewed and given to pt.  Wet prep results reviewed, no treatment required per standing orders. Condoms declined. Referral for nexplanon removal faxed to [facility] clinic on 11/22. Gaspar Garbe, RN
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
## 2023-03-03 ENCOUNTER — Encounter: Payer: Self-pay | Admitting: Emergency Medicine

## 2023-03-03 ENCOUNTER — Other Ambulatory Visit: Payer: Self-pay

## 2023-03-03 ENCOUNTER — Emergency Department
Admission: EM | Admit: 2023-03-03 | Discharge: 2023-03-04 | Disposition: A | Discharge status: Home / Self Care | Payer: Medicaid Other | Attending: Emergency Medicine | Admitting: Emergency Medicine

## 2023-03-03 DIAGNOSIS — N390 Urinary tract infection, site not specified: Secondary | ICD-10-CM | POA: Diagnosis not present

## 2023-03-03 DIAGNOSIS — R300 Dysuria: Secondary | ICD-10-CM | POA: Diagnosis present

## 2023-03-03 LAB — URINALYSIS, ROUTINE W REFLEX MICROSCOPIC
Bacteria, UA: NONE SEEN
Bilirubin Urine: NEGATIVE
Glucose, UA: NEGATIVE mg/dL
Ketones, ur: 20 mg/dL — AB
Nitrite: NEGATIVE
Protein, ur: 100 mg/dL — AB
RBC / HPF: >50 RBC/hpf (ref 0–5)
Specific Gravity, Urine: 1.021 (ref 1.005–1.030)
WBC, UA: >50 WBC/hpf (ref 0–5)
pH: 5 (ref 5.0–8.0)

## 2023-03-03 LAB — POC URINE PREG, ED: Preg Test, Ur: NEGATIVE

## 2023-03-03 MED ORDER — CEPHALEXIN 500 MG PO CAPS
500.0000 mg | ORAL_CAPSULE | Freq: Once | ORAL | Status: AC
  Administered 2023-03-04: 500 mg via ORAL
  Filled 2023-03-03: qty 1

## 2023-03-03 MED ORDER — CEPHALEXIN 500 MG PO CAPS: 500.0000 mg | ORAL_CAPSULE | Freq: Four times a day (QID) | ORAL | 0 refills | Status: AC

## 2023-03-03 NOTE — Discharge Instructions (Addendum)
 Take antibiotic for the full 5-day course as prescribed.  Thank you for choosing us  for your health care today!  Please see your primary doctor this week for a follow up appointment.   If you have any new, worsening, or unexpected symptoms call your doctor right away or come back to the emergency department for reevaluation.  It was my pleasure to care for you today.   Ginnie EDISON Cyrena, MD

## 2023-03-03 NOTE — ED Provider Notes (Signed)
 [facility] Provider Note    Event Date/Time   First MD Initiated Contact with Patient 03/03/23 2348     (approximate)   History   Dysuria   HPI  Dawn Howell is a 23 y.o. female   Past medical history of otherwise healthy young woman who presents emergency department with 1 day of dysuria.  Feels like a UTI that she has had in the past.  Suprapubic discomfort/fullness sensation as well.  No fevers or chills.  No other acute medical complaints.  Denies vaginal discharge.      Physical Exam   Triage Vital Signs: ED Triage Vitals [03/03/23 2131]  Encounter Vitals Group     BP 119/79     Systolic BP Percentile      Diastolic BP Percentile      Pulse Rate 88     Resp 16     Temp 98.7 F (37.1 C)     Temp Source Oral     SpO2 100 %     Weight 110 lb (49.9 kg)     Height 5' 2 (1.575 m)     Head Circumference      Peak Flow      Pain Score 10     Pain Loc      Pain Education      Exclude from Growth Chart     Most recent vital signs: Vitals:   03/03/23 2131  BP: 119/79  Pulse: 88  Resp: 16  Temp: 98.7 F (37.1 C)  SpO2: 100%    General: Awake, no distress.  CV:  Good peripheral perfusion.  Resp:  Normal effort.  Abd:  No distention.  Other:  Nontoxic well-appearing normal vital signs.  Soft benign abdominal exam deep palpation all quadrants.   ED Results / Procedures / Treatments   Labs (all labs ordered are listed, but only abnormal results are displayed) Labs Reviewed  URINALYSIS, ROUTINE W REFLEX MICROSCOPIC - Abnormal; Notable for the following components:      Result Value   Color, Urine YELLOW (*)    APPearance TURBID (*)    Hgb urine dipstick LARGE (*)    Ketones, ur 20 (*)    Protein, ur 100 (*)    Leukocytes,Ua MODERATE (*)    All other components within normal limits  URINE CULTURE  POC URINE PREG, ED     I ordered and reviewed the above labs they are notable for inflammatory changes of the urine, not  pregnant.  PROCEDURES:  Critical Care performed: No  Procedures   MEDICATIONS ORDERED IN ED: Medications  cephALEXin  (KEFLEX ) capsule 500 mg (has no administration in time range)   IMPRESSION / MDM / ASSESSMENT AND PLAN / ED COURSE  I reviewed the triage vital signs and the nursing notes.                                Patient's presentation is most consistent with acute presentation with potential threat to life or bodily function.  Differential diagnosis includes, but is not limited to, urinary tract infection, pregnancy related complication like ectopic, STI  MDM: Patient with symptoms reflecting lower urinary tract infection, with urinalysis suggestive of the same with inflammatory changes, sent for culture.  Benign abdominal exam rules against surgical pathologies as she and she is not pregnant.  She is well-appearing and looks okay for outpatient treatment.  First dose given in  the emergency department of Keflex , prescription for the same.  Discharge        FINAL CLINICAL IMPRESSION(S) / ED DIAGNOSES   Final diagnoses:  Lower urinary tract infectious disease     Rx / DC Orders   ED Discharge Orders          Ordered    Ambulatory Referral to Primary Care (Establish Care)        03/03/23 2359    cephALEXin  (KEFLEX ) 500 MG capsule  4 times daily        03/03/23 2359             Note:  This document was prepared using Dragon voice recognition software and may include unintentional dictation errors.    Cyrena Mylar, MD 03/04/23 STANLY

## 2023-03-03 NOTE — ED Triage Notes (Signed)
 Patient ambulatory to triage with steady gait, without difficulty or distress noted; pt reports this evening having "bladder pain" with dysuria and hematuria

## 2023-03-05 LAB — URINE CULTURE: Culture: <10000 — AB

## 2023-03-31 ENCOUNTER — Encounter: Payer: Medicaid Other | Admitting: Certified Nurse Midwife

## 2023-03-31 DIAGNOSIS — Z7689 Persons encountering health services in other specified circumstances: Secondary | ICD-10-CM

## 2023-03-31 DIAGNOSIS — Z113 Encounter for screening for infections with a predominantly sexual mode of transmission: Secondary | ICD-10-CM

## 2023-04-12 ENCOUNTER — Other Ambulatory Visit (HOSPITAL_COMMUNITY)
Admission: RE | Admit: 2023-04-12 | Discharge: 2023-04-12 | Disposition: A | Discharge status: Home / Self Care | Source: Ambulatory Visit | Attending: Certified Nurse Midwife | Admitting: Certified Nurse Midwife

## 2023-04-12 ENCOUNTER — Ambulatory Visit: Admitting: Certified Nurse Midwife

## 2023-04-12 VITALS — BP 122/73 | HR 77 | Wt 108.2 lb

## 2023-04-12 DIAGNOSIS — Z113 Encounter for screening for infections with a predominantly sexual mode of transmission: Secondary | ICD-10-CM | POA: Diagnosis present

## 2023-04-12 DIAGNOSIS — R102 Pelvic and perineal pain: Secondary | ICD-10-CM

## 2023-04-12 NOTE — Progress Notes (Signed)
    GYNECOLOGY PROGRESS NOTE  Subjective:    Patient ID: Dawn Howell, female    DOB: 28-Sep-2000, 23 y.o.   MRN: [id number]  HPI  Patient is a 23 y.o. G0P0000 female who presents for pelvic pain unrelated to cycles. Has lower left sided pelvic pain that worsens with IC. Has been treated 2-3 times this year for UTIs.    Review of Systems Pertinent items are noted in HPI.   Objective:   Blood pressure 122/73, pulse 77, weight 108 lb 3.2 oz (49.1 kg). Body mass index is 19.79 kg/m. General appearance: alert and cooperative Abdomen: soft, non-tender; bowel sounds normal; no masses,  no organomegaly    Assessment:   1. Screening examination for STD (sexually transmitted disease)   2. Pelvic pain      Plan:   1. Screening examination for STD (sexually transmitted disease) (Primary) - Cervicovaginal ancillary only  2. Pelvic pain -Will rule out infection today.  -Refer to urology for recurrent UTI management if no findings on [place] or vaginal swabs.  - US PELVIS TRANSVAGINAL NON-OB (TV ONLY); Future

## 2023-04-14 LAB — CERVICOVAGINAL ANCILLARY ONLY
Bacterial Vaginitis (gardnerella): POSITIVE — AB
Candida Glabrata: NEGATIVE
Candida Vaginitis: NEGATIVE
Chlamydia: NEGATIVE
Comment: NEGATIVE
Comment: NEGATIVE
Comment: NEGATIVE
Comment: NEGATIVE
Comment: NEGATIVE
Comment: NORMAL
Neisseria Gonorrhea: NEGATIVE
Trichomonas: NEGATIVE

## 2023-04-18 ENCOUNTER — Other Ambulatory Visit: Payer: Self-pay | Admitting: Certified Nurse Midwife

## 2023-04-18 MED ORDER — METRONIDAZOLE 500 MG PO TABS
500.0000 mg | ORAL_TABLET | Freq: Two times a day (BID) | ORAL | 0 refills | Status: AC
Start: 2023-04-18 — End: 2023-04-25

## 2023-05-12 ENCOUNTER — Other Ambulatory Visit: Payer: Self-pay

## 2023-05-12 DIAGNOSIS — B9689 Other specified bacterial agents as the cause of diseases classified elsewhere: Secondary | ICD-10-CM

## 2023-05-12 MED ORDER — METRONIDAZOLE 500 MG PO TABS: 500.0000 mg | ORAL_TABLET | Freq: Two times a day (BID) | ORAL | 0 refills | Status: DC

## 2023-05-13 ENCOUNTER — Other Ambulatory Visit

## 2023-05-16 ENCOUNTER — Other Ambulatory Visit

## 2023-05-16 ENCOUNTER — Other Ambulatory Visit: Payer: Self-pay

## 2023-05-16 ENCOUNTER — Telehealth: Payer: Self-pay | Admitting: Certified Nurse Midwife

## 2023-05-16 DIAGNOSIS — R102 Pelvic and perineal pain: Secondary | ICD-10-CM

## 2023-05-16 NOTE — Telephone Encounter (Signed)
 Reached out to pt about US  that was scheduled on 05/16/2023 at 10:15.  Will need to give the number for Centralized Scheduling so the pt can call and get the US  rescheduled.

## 2023-05-17 ENCOUNTER — Encounter: Payer: Self-pay | Admitting: Certified Nurse Midwife

## 2023-05-17 NOTE — Telephone Encounter (Signed)
 Reached out to pt (2x) about US  that was scheduled on 05/16/2023 at 10:15.  Will need to give number for Centralized Scheduling so pt can call and get the US  rescheduled.  Will send a MyChart letter to pt.

## 2023-06-30 ENCOUNTER — Encounter: Payer: Self-pay | Admitting: Certified Nurse Midwife

## 2023-06-30 NOTE — Addendum Note (Signed)
 Addended by: Lendon Queen on: 06/30/2023 02:28 PM   Modules accepted: Orders

## 2023-07-06 ENCOUNTER — Ambulatory Visit (INDEPENDENT_AMBULATORY_CARE_PROVIDER_SITE_OTHER)

## 2023-07-06 ENCOUNTER — Ambulatory Visit

## 2023-07-06 VITALS — BP 117/77 | HR 60 | Ht 62.0 in | Wt 110.0 lb

## 2023-07-06 DIAGNOSIS — Z113 Encounter for screening for infections with a predominantly sexual mode of transmission: Secondary | ICD-10-CM

## 2023-07-06 DIAGNOSIS — R1032 Left lower quadrant pain: Secondary | ICD-10-CM

## 2023-07-06 DIAGNOSIS — N898 Other specified noninflammatory disorders of vagina: Secondary | ICD-10-CM

## 2023-07-06 NOTE — Progress Notes (Signed)
    NURSE VISIT NOTE  Subjective:    Patient ID: Dawn Howell, female    DOB: March 06, 2000, 23 y.o.   MRN: [id number]  HPI  Patient is a 23 y.o. G0P0000 female who presents for STD screening.  white and milky vaginal discharge for 1 week(s). Denies abnormal vaginal bleeding or significant pelvic pain or fever. reports dysuria and vaginal discharge. Patient has history of known exposure to STD. Patient reports that she was treated for BV two months ago but states that she had difficulty taking metronidazole  orally.    Objective:    BP 117/77   Pulse 60   Ht 5' 2 (1.575 m)   Wt 110 lb (49.9 kg)   LMP 06/18/2023 (Exact Date)   BMI 20.12 kg/m      Assessment:   1. Screening for STD (sexually transmitted disease)   2. Vaginal discharge     rule out GC or chlamydia and nonspecific vaginitis  Plan:   GC and chlamydia DNA  probe sent to lab. Treatment: will await results of swab taken today.  Treatment: Patient requested that if results are positive for BV again that we dont send in oral antibiotic because she states its difficult to tolerate.  ROV prn if symptoms persist or worsen.   Inga Manges, CMA

## 2023-07-06 NOTE — Patient Instructions (Signed)
 Vaginal Infection (Bacterial Vaginosis): What to Know  Bacterial vaginosis is an infection of the vagina. It happens when the balance of normal germs (bacteria) in the vagina changes. If you don't get treated, it can make it easier for you to get other infections from sex. These are called sexually transmitted infections (STIs). If you're pregnant, you need to get treated right away. This infection can cause a baby to be born early or at a low birth weight. What are the causes? This infection happens when too many harmful germs grow in the vagina. You can't get this infection from toilet seats, bedsheets, swimming pools, or things that touch your vagina. What increases the risk? Having sex with a new person or more than one person. Having sex without protection. Douching. Having an intrauterine device (IUD). Smoking. Using drugs or drinking alcohol. These can lead you to do risky things. Taking certain antibiotics. Being pregnant. What are the signs or symptoms? Some females have no symptoms. Symptoms may include: A gray or white discharge from your vagina. It can be watery or foamy. A fishy smell. This can happen after sex or during your menstrual period. Itching in and around your vagina. Burning or pain when you pee. How is this treated? This infection is treated with antibiotics. These may be given to you as: A pill. A cream for your vagina. A medicine that you put into your vagina (suppository). If the infection comes back, you may need more antibiotics. Follow these instructions at home: Medicines Take your medicines as told. Take or use your antibiotics as told. Do not stop using them even if you start to feel better. General instructions If the person you have sex with is a female, tell her that you have this infection. She will need to follow up with her doctor. Female partners don't need to be treated. Do not have sex until you finish treatment. Drink more fluids as  told. Keep your vagina and butt clean. Wash these areas with warm water each day. Wipe from front to back after you poop. If you're breastfeeding a baby, talk to your doctor if you should keep doing so during treatment. How is this prevented? Self-care Do not douche. Do not use deodorant sprays on your vagina. Wear cotton underwear. Do not wear tight pants and pantyhose, especially in the summer. Safe sex Use condoms the correct way and every time you have sex. Use dental dams to protect yourself during oral sex. Limit how many people you have sex with. Get tested for STIs. The person you have sex with should also get tested. Drugs and alcohol Do not smoke, vape, or use nicotine or tobacco. Do not use drugs. Limit the amount of alcohol you drink because it can lead you to do risky things. Where to find more information To learn more: Go to TonerPromos.no. Click Health Topics A-Z. Type "bacterial vaginosis" in the search bar. American Sexual Health Association (ASHA): ashasexualhealth.org U.S. Department of Health and [employer], Office on Women's Health: TravelLesson.ca Contact a doctor if: Your symptoms don't get better, even after treatment. You have more discharge or pain when you pee. You have a fever or chills. You have pain in your belly or in the area between your hips. You have pain during sex. You bleed from your vagina between menstrual periods. This information is not intended to replace advice given to you by your health care provider. Make sure you discuss any questions you have with your health care provider. Document  Revised: 06/30/2022 Document Reviewed: 06/30/2022 Elsevier Patient Education  2024 [employer].

## 2023-07-07 LAB — CERVICOVAGINAL ANCILLARY ONLY
Bacterial Vaginitis (gardnerella): POSITIVE — AB
Candida Glabrata: NEGATIVE
Candida Vaginitis: NEGATIVE
Chlamydia: NEGATIVE
Comment: NEGATIVE
Comment: NEGATIVE
Comment: NEGATIVE
Comment: NEGATIVE
Comment: NEGATIVE
Comment: NORMAL
Neisseria Gonorrhea: NEGATIVE
Trichomonas: NEGATIVE

## 2023-07-08 ENCOUNTER — Ambulatory Visit: Payer: Self-pay

## 2023-07-08 DIAGNOSIS — N760 Acute vaginitis: Secondary | ICD-10-CM

## 2023-07-08 MED ORDER — METRONIDAZOLE 500 MG PO TABS: 500.0000 mg | ORAL_TABLET | Freq: Two times a day (BID) | ORAL | 0 refills | Status: DC

## 2023-07-11 MED ORDER — METRONIDAZOLE 0.75 % VA GEL: 1.0000 | Freq: Every day | VAGINAL | 1 refills | Status: AC

## 2023-07-11 NOTE — Addendum Note (Signed)
 Addended by: Arcelia Bean on: 07/11/2023 08:52 AM   Modules accepted: Orders

## 2023-10-05 ENCOUNTER — Ambulatory Visit

## 2023-10-05 ENCOUNTER — Other Ambulatory Visit (HOSPITAL_COMMUNITY)
Admission: RE | Admit: 2023-10-05 | Discharge: 2023-10-05 | Disposition: A | Discharge status: Home / Self Care | Source: Ambulatory Visit | Attending: Certified Nurse Midwife | Admitting: Certified Nurse Midwife

## 2023-10-05 VITALS — BP 100/65 | HR 85 | Ht 63.0 in | Wt 110.0 lb

## 2023-10-05 DIAGNOSIS — Z113 Encounter for screening for infections with a predominantly sexual mode of transmission: Secondary | ICD-10-CM | POA: Diagnosis not present

## 2023-10-05 NOTE — Progress Notes (Signed)
    NURSE VISIT NOTE  Subjective:    Patient ID: Dawn Howell, female    DOB: Oct 14, 2000, 23 y.o.   MRN: [id number]  HPI  Patient is a 23 y.o. G0P0000 female who presents for self swab visit. Would like STD testing, plus BV and Yeast testing. Has been having increased vaginal discharge. Denies vaginal odor and/or itching.  Objective:    BP 100/65   Pulse 85   Ht 5' 3 (1.6 m)   Wt 110 lb (49.9 kg)   LMP 09/06/2023 (Exact Date)   BMI 19.49 kg/m    Assessment:   1. Screening for STD (sexually transmitted disease)    Plan:   Aptima sent to lab. Treatment: Will wait for results to be treated if needed. ROV prn if symptoms persist or worsen.   Beola Skeens, CMA

## 2023-10-05 NOTE — Patient Instructions (Signed)
 Chlamydia, Female  Chlamydia is a sexually transmitted infection (STI). This infection spreads through sexual contact. Chlamydia can occur in different areas of the body, including: The urethra. This is the part of the body that drains urine from the bladder. The cervix. This is the lowest part of the uterus. The throat. The rectum. This condition is not difficult to treat. However, if left untreated, chlamydia can lead to more serious health problems, including pelvic inflammatory disease (PID). PID can increase your risk of being unable to have children. In pregnant women, untreated chlamydia can cause serious complications during pregnancy or health problems for the baby after delivery. What are the causes? This condition is caused by a bacteria called Chlamydia trachomatis. The bacteria are spread from an infected partner during sexual activity. Chlamydia can spread through contact with the genitals, mouth, or rectum. What increases the risk? The following factors may make you more likely to develop this condition: Not using a condom the right way or not using a condom every time you have sex. Having a new sex partner or having more than one sex partner. Being sexually active before age 33. What are the signs or symptoms? In some cases, there are no symptoms, especially early in the infection. If symptoms develop, they may include: Urinating often, or a burning feeling during urination. Redness, soreness, or swelling of the vagina or rectum. Discharge coming from the vagina or rectum. Pain in the abdomen. Pain during sex. Bleeding between menstrual periods or irregular periods. How is this diagnosed? This condition may be diagnosed with: Urine tests. Swab tests. Depending on your symptoms, your health care provider may use a cotton swab to collect a fluid sample from your vagina, rectum, nose, or throat to test for the bacteria. A pelvic exam. How is this treated? This condition is  treated with antibiotic medicines. Follow these instructions at home: Sexual activity Tell your sex partner or partners about your infection. These include any partners for oral, anal, or vaginal sex that you have had within 60 days of when your symptoms started. Sex partners should also be treated, even if they have no signs of the infection. Do not have sex until you and your sex partners have completed treatment and your health care provider says it is okay. If your health care provider prescribed you a single-dose medicine as treatment, wait at least 7 days after taking the medicine before having sex. General instructions Take over-the-counter and prescription medicines as told by your health care provider. Finish all antibiotic medicine even when you start to feel better. It is up to you to get your test results. Ask your health care provider, or the department that is doing the test, when your results will be ready. Keep all follow-up visits. This is important. You may need to be tested for infection again 3 months after treatment. How is this prevented? You can lower your risk of getting chlamydia by: Using latex or polyurethane condoms correctly every time you have sex. Not having multiple sex partners. Asking if your sex partner has been tested for STIs and had negative results. Getting regular health screenings to check for STIs. Contact a health care provider if: You develop new symptoms or your symptoms are getting worse. Your symptoms do not get better after treatment. You have a fever or chills. You have pain during sex. You have irregular menstrual periods, or you have bleeding between periods or after sex. You develop flu-like symptoms, such as night sweats, sore throat,  or muscle aches. You are unable to take your antibiotic medicine as prescribed. Summary Chlamydia is a sexually transmitted infection (STI) that is caused by bacteria. This infection spreads through sexual  contact. This condition is treated with antibiotic medicines. If left untreated, chlamydia can lead to more serious health problems, including pelvic inflammatory disease (PID). Your sex partners will also need to be treated. Do not have sex until both you and your partner have been treated. Take medicines as directed by your health care provider and keep all follow-up visits to ensure your infection has been completely treated. This information is not intended to replace advice given to you by your health care provider. Make sure you discuss any questions you have with your health care provider. Document Revised: 10/27/2020 Document Reviewed: 11/03/2020 Elsevier Patient Education  2024 [employer].

## 2023-10-07 ENCOUNTER — Other Ambulatory Visit: Payer: Self-pay

## 2023-10-07 ENCOUNTER — Ambulatory Visit: Payer: Self-pay

## 2023-10-07 DIAGNOSIS — B9689 Other specified bacterial agents as the cause of diseases classified elsewhere: Secondary | ICD-10-CM

## 2023-10-07 LAB — CERVICOVAGINAL ANCILLARY ONLY
Bacterial Vaginitis (gardnerella): POSITIVE — AB
Candida Glabrata: NEGATIVE
Candida Vaginitis: NEGATIVE
Chlamydia: NEGATIVE
Comment: NEGATIVE
Comment: NEGATIVE
Comment: NEGATIVE
Comment: NEGATIVE
Comment: NEGATIVE
Comment: NORMAL
Neisseria Gonorrhea: NEGATIVE
Trichomonas: NEGATIVE

## 2023-10-07 MED ORDER — METRONIDAZOLE 0.75 % VA GEL: 1.0000 | Freq: Every day | VAGINAL | 0 refills | Status: DC

## 2023-10-07 MED ORDER — METRONIDAZOLE 500 MG PO TABS: 500.0000 mg | ORAL_TABLET | Freq: Two times a day (BID) | ORAL | 0 refills | Status: DC

## 2023-10-07 NOTE — Progress Notes (Signed)
 Patient requests metrogel  instead of metronidazole  pills.  She says the pills make her vomit.  Metrogel  sent to patient pharmacy.  Rx for tablets discontinued.

## 2023-10-07 NOTE — Progress Notes (Signed)
 Rx for metronidazole  to treat BV.

## 2023-12-01 ENCOUNTER — Ambulatory Visit: Payer: Self-pay

## 2023-12-09 ENCOUNTER — Ambulatory Visit (INDEPENDENT_AMBULATORY_CARE_PROVIDER_SITE_OTHER)

## 2023-12-09 VITALS — BP 120/84 | HR 78 | Resp 16 | Ht 63.0 in | Wt 112.2 lb

## 2023-12-09 DIAGNOSIS — N912 Amenorrhea, unspecified: Secondary | ICD-10-CM

## 2023-12-09 DIAGNOSIS — Z3201 Encounter for pregnancy test, result positive: Secondary | ICD-10-CM

## 2023-12-09 LAB — POCT URINE PREGNANCY: Preg Test, Ur: POSITIVE — AB

## 2023-12-09 NOTE — Progress Notes (Signed)
    NURSE VISIT NOTE  Subjective:    Patient ID: Dawn Howell, female    DOB: 2000/02/08, 23 y.o.   MRN: [id number]  HPI  Patient is a 23 y.o. G0P0000 female who presents for evaluation of amenorrhea. She believes she could be pregnant. Pregnancy was unplanned, she is unsure if she wants to continue pregnancy Sexual Activity: single partner, contraception: none. Current symptoms also include: breast tenderness, fatigue, frequent urination, and positive home pregnancy test. Last period was abnormal.    Objective:    BP 120/84   Pulse 78   Resp 16   Ht 5' 3 (1.6 m)   Wt 112 lb 3.2 oz (50.9 kg)   LMP 11/01/2023   BMI 19.88 kg/m   Lab Review  Results for orders placed or performed in visit on 12/09/23  POCT urine pregnancy  Result Value Ref Range   Preg Test, Ur Positive (A) Negative    Assessment:   1. Amenorrhea     Plan:   Pregnancy Test: Positive  Estimated Date of Delivery: None noted. BP Cuff Measurement taken. Cuff Size Adult Small Encouraged well-balanced diet, plenty of rest when needed, pre-natal vitamins daily and walking for exercise.  Discussed self-help for nausea, avoiding OTC medications until consulting provider or pharmacist, other than Tylenol  as needed, minimal caffeine (1-2 cups daily) and avoiding alcohol.   She will schedule her nurse visit @ 7-[redacted] wks pregnant, u/s for dating @10  wk, and NOB visit at [redacted] wk pregnant.    Feel free to call with any questions. She is unsure if she will continue with pregnancy. Termination options were given to patient. Told her to call back and schedule NOB intake, U/S if she decides to continue with it. She verbalized understanding.    Camelia Fetters, CMA [facility]

## 2023-12-09 NOTE — Patient Instructions (Signed)
 Morning Sickness  Morning sickness is when you throw up or feel like you may throw up during pregnancy. This condition often occurs in the morning, but it can also occur at any time of day. Morning sickness is most common during the first three months of pregnancy, but it can go on throughout the pregnancy. Morning sickness is usually harmless. But if you throw up all the time, you should see your health care provider. You may also hear this condition called nausea and vomiting of pregnancy. What are the causes? The cause of morning sickness is not known. It may be linked to changes in hormones during pregnancy. What increases the risk? You're more likely to have morning sickness if: You had morning sickness in another pregnancy. You're pregnant with more than one baby, such as twins. You had morning sickness in other pregnancies. You have had motion sickness before you were pregnant. You have had bad headaches or migraines before you were pregnant. What are the signs or symptoms? Symptoms of morning sickness include: Feeling like you may throw up. Throwing up. How is this diagnosed? Morning sickness is diagnosed based on your symptoms. How is this treated? Treatment is usually not needed for morning sickness. You may only need to change what you eat. In some cases, your provider may give you: Vitamin B6 supplements. Medicines to prevent throwing up. Ginger. Follow these instructions at home: Medicines Take your medicines only as told by your provider. Do not use any prescription, over-the-counter, or herbal medicines for morning sickness without first talking with your provider. Take prenatal vitamins. These can stop or lessen the symptoms of morning sickness. If you feel like you may throw up after taking prenatal vitamins, take them at night or with a snack. Eating and drinking     Eat dry toast or crackers before getting out of bed. Eat 5 or 6 small meals a day. Try ginger  ale made with real ginger, ginger tea, or ginger candies. Drink fluids throughout the day. Eat protein foods when you need a snack. Nuts, yogurt, and cheese are good choices. Eat dry and bland foods like rice or baked potatoes. Foods that are high in carbohydrates are often helpful. Have someone cook for you if the smell of food makes you want to throw up. Foods to avoid Greasy foods. Fatty foods. Spicy foods. General instructions Try to avoid smells that make you feel sick. Use an air purifier to keep the air in your house free of smells. Try using an acupressure wristband. This is a wristband that's used to treat motion sickness. Try acupuncture. In this treatment, a provider puts thin needles into certain areas of your body to make you feel better. Brush your teeth after throwing up or rinse with a mix of baking soda and water. The acid in throw-up can hurt your teeth. Contact a health care provider if: Your symptoms do not get better. You feel dizzy or light-headed. You're losing weight. Get help right away if: The feeling that you may throw up will not go away, or you can't stop throwing up. You faint. You have very bad pain in your belly. This information is not intended to replace advice given to you by your health care provider. Make sure you discuss any questions you have with your health care provider. Document Revised: 10/14/2022 Document Reviewed: 04/22/2022 Elsevier Patient Education  2024 [employer].  First Trimester of Pregnancy  The first trimester of pregnancy starts on the first day of your  last monthly period until the end of week 13. This is months 1 through 3 of pregnancy. A week after a sperm fertilizes an egg, the egg will implant into the wall of the uterus and begin to develop into a baby. Body changes during your first trimester Your body goes through many changes during pregnancy. The changes usually return to normal after your baby is born. Physical  changes Your breasts may grow larger and may hurt. The area around your nipples may get darker. Your periods will stop. Your hair and nails may grow faster. You may pee more often. Health changes You may tire easily. Your gums may bleed and may be sensitive when you brush and floss. You may not feel hungry. You may have heartburn. You may throw up or feel like you may throw up. You may want to eat some foods, but not others. You may have headaches. You may have trouble pooping (constipation). Other changes Your emotions may change from day to day. You may have more dreams. Follow these instructions at home: Medicines Talk to your health care provider if you're taking medicines. Ask if the medicines are safe to take during pregnancy. Your provider may change the medicines that you take. Do not take any medicines unless told to by your provider. Take a prenatal vitamin that has at least 600 micrograms (mcg) of folic acid. Do not use herbal medicines, illegal substances, or medicines that are not approved by your provider. Eating and drinking While you're pregnant your body needs extra food for your growing baby. Talk with your provider about what to eat while pregnant. Activity Most women are able to exercise during pregnancy. Exercises may need to change as your pregnancy goes on. Talk to your provider about your activities and exercise routines. Relieving pain and discomfort Wear a good, supportive bra if your breasts hurt. Rest with your legs raised if you have leg cramps or low back pain. Safety Wear your seatbelt at all times when you're in a car. Talk to your provider if someone hits you, hurts you, or yells at you. Talk with your provider if you're feeling sad or have thoughts of hurting yourself. Lifestyle Certain things can be harmful while you're pregnant. Follow these rules: Do not use hot tubs, steam rooms, or saunas. Do not douche. Do not use tampons or scented  pads. Do not drink alcohol,smoke, vape, or use products with nicotine or tobacco in them. If you need help quitting, talk with your provider. Avoid cat litter boxes and soil used by cats. These things carry germs that can cause harm to your pregnancy and your baby. General instructions Keep all follow-up visits. It helps you and your unborn baby stay as healthy as possible. Write down your questions. Take them to your visits. Your provider will: Talk with you about your overall health. Give you advice or refer you to specialists who can help with different needs, including: Prenatal education classes. Mental health and counseling. Foods and healthy eating. Ask for help if you need help with food. Call your dentist and ask to be seen. Brush your teeth with a soft toothbrush. Floss gently. Where to find more information American Pregnancy Association: americanpregnancy.org [employer]: acog.org Office on [employer] Health: [employer] Contact a health care provider if: You feel dizzy, faint, or have a fever. You vomit or have watery poop (diarrhea) for 2 days or more. You have abnormal discharge or bleeding from your vagina. You have pain when  you pee or your pee smells bad. You have cramps, pain, or pressure in your belly area. Get help right away if: You have trouble breathing or chest pain. You have any kind of injury, such as from a fall or a car crash. These symptoms may be an emergency. Get help right away. Call 911. Do not wait to see if the symptoms will go away. Do not drive yourself to the hospital. This information is not intended to replace advice given to you by your health care provider. Make sure you discuss any questions you have with your health care provider. Document Revised: 10/14/2022 Document Reviewed: 05/14/2022 Elsevier Patient Education  2024 [employer]  Commonly Asked Questions During Pregnancy  Cats: A parasite  can be excreted in cat feces.  To avoid exposure you need to have another person empty the little box.  If you must empty the litter box you will need to wear gloves.  Wash your hands after handling your cat.  This parasite can also be found in raw or undercooked meat so this should also be avoided.  Colds, Sore Throats, Flu: Please check your medication sheet to see what you can take for symptoms.  If your symptoms are unrelieved by these medications please call the office.  Dental Work: Most any dental work Agricultural consultant recommends is permitted.  X-rays should only be taken during the first trimester if absolutely necessary.  Your abdomen should be shielded with a lead apron during all x-rays.  Please notify your provider prior to receiving any x-rays.  Novocaine is fine; gas is not recommended.  If your dentist requires a note from [place] prior to dental work please call the office and we will provide one for you.  Exercise: Exercise is an important part of staying healthy during your pregnancy.  You may continue most exercises you were accustomed to prior to pregnancy.  Later in your pregnancy you will most likely notice you have difficulty with activities requiring balance like riding a bicycle.  It is important that you listen to your body and avoid activities that put you at a higher risk of falling.  Adequate rest and staying well hydrated are a must!  If you have questions about the safety of specific activities ask your provider.    Exposure to Children with illness: Try to avoid obvious exposure; report any symptoms to [place] when noted,  If you have chicken pos, red measles or mumps, you should be immune to these diseases.   Please do not take any vaccines while pregnant unless you have checked with your OB provider.  Fetal Movement: After 28 weeks we recommend you do "kick counts" twice daily.  Lie or sit down in a calm quiet environment and count your baby movements "kicks".  You should feel your baby  at least 10 times per hour.  If you have not felt 10 kicks within the first hour get up, walk around and have something sweet to eat or drink then repeat for an additional hour.  If count remains less than 10 per hour notify your provider.  Fumigating: Follow your pest control agent's advice as to how long to stay out of your home.  Ventilate the area well before re-entering.  Hemorrhoids:   Most over-the-counter preparations can be used during pregnancy.  Check your medication to see what is safe to use.  It is important to use a stool softener or fiber in your diet and to drink lots of liquids.  If hemorrhoids seem to be getting worse please call the office.   Hot Tubs:  Hot tubs Jacuzzis and saunas are not recommended while pregnant.  These increase your internal body temperature and should be avoided.  Intercourse:  Sexual intercourse is safe during pregnancy as long as you are comfortable, unless otherwise advised by your provider.  Spotting may occur after intercourse; report any bright red bleeding that is heavier than spotting.  Labor:  If you know that you are in labor, please go to the hospital.  If you are unsure, please call the office and let us help you decide what to do.  Lifting, straining, etc:  If your job requires heavy lifting or straining please check with your provider for any limitations.  Generally, you should not lift items heavier than that you can lift simply with your hands and arms (no back muscles)  Painting:  Paint fumes do not harm your pregnancy, but may make you ill and should be avoided if possible.  Latex or water based paints have less odor than oils.  Use adequate ventilation while painting.  Permanents & Hair Color:  Chemicals in hair dyes are not recommended as they cause increase hair dryness which can increase hair loss during pregnancy.  " Highlighting" and permanents are allowed.  Dye may be absorbed differently and permanents may not hold as well during  pregnancy.  Sunbathing:  Use a sunscreen, as skin burns easily during pregnancy.  Drink plenty of fluids; avoid over heating.  Tanning Beds:  Because their possible side effects are still unknown, tanning beds are not recommended.  Ultrasound Scans:  Routine ultrasounds are performed at approximately 20 weeks.  You will be able to see your baby's general anatomy an if you would like to know the gender this can usually be determined as well.  If it is questionable when you conceived you may also receive an ultrasound early in your pregnancy for dating purposes.  Otherwise ultrasound exams are not routinely performed unless there is a medical necessity.  Although you can request a scan we ask that you pay for it when conducted because insurance does not cover " patient request" scans.  Work: If your pregnancy proceeds without complications you may work until your due date, unless your physician or employer advises otherwise.  Round Ligament Pain/Pelvic Discomfort:  Sharp, shooting pains not associated with bleeding are fairly common, usually occurring in the second trimester of pregnancy.  They tend to be worse when standing up or when you remain standing for long periods of time.  These are the result of pressure of certain pelvic ligaments called "round ligaments".  Rest, Tylenol and heat seem to be the most effective relief.  As the womb and fetus grow, they rise out of the pelvis and the discomfort improves.  Please notify the office if your pain seems different than that described.  It may represent a more serious condition.

## 2024-02-29 ENCOUNTER — Ambulatory Visit

## 2024-02-29 ENCOUNTER — Other Ambulatory Visit (HOSPITAL_COMMUNITY): Admission: RE | Admit: 2024-02-29 | Discharge: 2024-02-29 | Disposition: A | Source: Ambulatory Visit

## 2024-02-29 VITALS — BP 96/64 | HR 65 | Ht 62.0 in | Wt 113.0 lb

## 2024-02-29 DIAGNOSIS — Z113 Encounter for screening for infections with a predominantly sexual mode of transmission: Secondary | ICD-10-CM

## 2024-02-29 DIAGNOSIS — N898 Other specified noninflammatory disorders of vagina: Secondary | ICD-10-CM

## 2024-02-29 NOTE — Progress Notes (Signed)
" ° ° °  NURSE VISIT NOTE  Subjective:    Patient ID: Dawn Howell, female    DOB: January 24, 2001, 24 y.o.   MRN: [id number]  HPI  Patient is a 24 y.o. [id number] female who presents for abnormal vaginal discharge for 2 week(s). Denies abnormal vaginal bleeding or significant pelvic pain or fever. denies dysuria and urinary urgency. Patient has a history of known exposure to STD.   Objective:    BP 96/64   Pulse 65   Ht 5' 2 (1.575 m)   Wt 113 lb (51.3 kg)   LMP 11/01/2023   Breastfeeding Unknown   BMI 20.67 kg/m    No results found for any visits on 02/29/24.  Assessment:   1. Vaginal discharge   2. Screening for STD (sexually transmitted disease)     nonspecific vaginitis  Plan:   GC and chlamydia DNA  probe sent to lab. ROV prn if symptoms persist or worsen.   Mathis LITTIE Getting, CMA  "

## 2024-03-01 ENCOUNTER — Ambulatory Visit: Payer: Self-pay

## 2024-03-01 ENCOUNTER — Other Ambulatory Visit: Payer: Self-pay

## 2024-03-01 DIAGNOSIS — B9689 Other specified bacterial agents as the cause of diseases classified elsewhere: Secondary | ICD-10-CM

## 2024-03-01 LAB — CERVICOVAGINAL ANCILLARY ONLY
Bacterial Vaginitis (gardnerella): POSITIVE — AB
Candida Glabrata: NEGATIVE
Candida Vaginitis: NEGATIVE
Chlamydia: NEGATIVE
Comment: NEGATIVE
Comment: NEGATIVE
Comment: NEGATIVE
Comment: NEGATIVE
Comment: NEGATIVE
Comment: NORMAL
Neisseria Gonorrhea: NEGATIVE
Trichomonas: NEGATIVE

## 2024-03-01 LAB — HEPATITIS B SURFACE ANTIGEN: Hepatitis B Surface Ag: NEGATIVE

## 2024-03-01 LAB — SYPHILIS: RPR W/REFLEX TO RPR TITER AND TREPONEMAL ANTIBODIES, TRADITIONAL SCREENING AND DIAGNOSIS ALGORITHM: RPR Ser Ql: NONREACTIVE

## 2024-03-01 LAB — HIV ANTIBODY (ROUTINE TESTING W REFLEX): HIV Screen 4th Generation wRfx: NONREACTIVE

## 2024-03-01 LAB — HEPATITIS C ANTIBODY: Hep C Virus Ab: NONREACTIVE

## 2024-03-01 MED ORDER — METRONIDAZOLE 500 MG PO TABS: 500.0000 mg | ORAL_TABLET | Freq: Two times a day (BID) | ORAL | 0 refills | Status: DC

## 2024-03-01 NOTE — Progress Notes (Signed)
 Rx to treat BV diagnosed on vaginal swab.

## 2024-03-02 ENCOUNTER — Other Ambulatory Visit: Payer: Self-pay

## 2024-03-02 DIAGNOSIS — B9689 Other specified bacterial agents as the cause of diseases classified elsewhere: Secondary | ICD-10-CM

## 2024-03-02 MED ORDER — METRONIDAZOLE 0.75 % VA GEL: 1.0000 | Freq: Every day | VAGINAL | 0 refills | Status: AC

## 2024-03-02 NOTE — Progress Notes (Signed)
 Pt requests metronidazole  gel instead of pills for BV.  Original Rx discontinued; new rx submitted.
